# Patient Record
Sex: Male | Born: 1990 | Race: White | Hispanic: No | Marital: Single | Smoking: Never smoker
Health system: Southern US, Community
[De-identification: ages and names within clinical notes are randomized; demographics above are authoritative.]

---
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
## 2015-07-25 ENCOUNTER — Encounter (HOSPITAL_COMMUNITY): Payer: Self-pay | Admitting: *Deleted

## 2015-07-25 ENCOUNTER — Emergency Department (HOSPITAL_COMMUNITY)
Admission: EM | Admit: 2015-07-25 | Discharge: 2015-07-25 | Disposition: A | Discharge status: Home / Self Care | Payer: BLUE CROSS/BLUE SHIELD | Attending: Emergency Medicine | Admitting: Emergency Medicine

## 2015-07-25 DIAGNOSIS — J029 Acute pharyngitis, unspecified: Secondary | ICD-10-CM | POA: Diagnosis present

## 2015-07-25 DIAGNOSIS — J069 Acute upper respiratory infection, unspecified: Secondary | ICD-10-CM | POA: Diagnosis not present

## 2015-07-25 LAB — RAPID STREP SCREEN (MED CTR MEBANE ONLY): Streptococcus, Group A Screen (Direct): NEGATIVE

## 2015-07-25 NOTE — ED Notes (Signed)
Pt reports a sore throat for one week . Pt denies any fever

## 2015-07-25 NOTE — ED Notes (Signed)
Declined W/C at D/C and was escorted to lobby by RN. 

## 2015-07-25 NOTE — ED Provider Notes (Signed)
CSN: [id number]     Arrival date & time 07/25/15  1048 History  By signing my name below, I, Aaron Aguirre, attest that this documentation has been prepared under the direction and in the presence of Glean HessElizabeth Westfall, PA-C Electronically Signed: Charline BillsEssence Aguirre, ED Scribe 07/25/2015 at 11:56 AM.   Chief Complaint  Patient presents with  . Sore Throat    The history is provided by the patient. No language interpreter was used.    HPI Comments: Georga BoraZachary T Aguirre is a 25 y.o. male who presents to the Emergency Department complaining of persistent sore throat for the past week. Pt reports associated nasal congestion, postnasal drip, sinus pressure, and productive cough for the past 2 days. He denies exacerbating factors. He has tried Mucinex and a Environmental consultanteti Pot without significant relief. He denies fever, chills, chest pain, SOB, abdominal pain, nausea, vomiting. No sick contacts.   History reviewed. No pertinent past medical history. History reviewed. No pertinent past surgical history. History reviewed. No pertinent family history. Social History  Substance Use Topics  . Smoking status: Never Smoker   . Smokeless tobacco: Never Used  . Alcohol Use: No     Review of Systems  Constitutional: Negative for fever and chills.  HENT: Positive for congestion, postnasal drip, sinus pressure and sore throat.   Respiratory: Positive for cough. Negative for shortness of breath.   Cardiovascular: Negative for chest pain.  Gastrointestinal: Negative for nausea, vomiting and abdominal pain.    Allergies  Review of patient's allergies indicates no known allergies.  Home Medications   Prior to Admission medications   Not on File    BP 140/80 mmHg  Pulse 91  Temp(Src) 98 F (36.7 C) (Oral)  Resp 19  Ht 5\' 6"  (1.676 m)  Wt 240 lb (108.863 kg)  BMI 38.76 kg/m2  SpO2 97% Physical Exam  Constitutional: He is oriented to person, place, and time. He appears well-developed and well-nourished. No  distress.  HENT:  Head: Normocephalic and atraumatic.  Right Ear: External ear normal.  Left Ear: External ear normal.  Nose: Nose normal. Right sinus exhibits no maxillary sinus tenderness and no frontal sinus tenderness. Left sinus exhibits no maxillary sinus tenderness and no frontal sinus tenderness.  Mouth/Throat: Uvula is midline, oropharynx is clear and moist and mucous membranes are normal. No oropharyngeal exudate, posterior oropharyngeal edema, posterior oropharyngeal erythema or tonsillar abscesses.  Eyes: Conjunctivae, EOM and lids are normal. Pupils are equal, round, and reactive to light. Right eye exhibits no discharge. Left eye exhibits no discharge. No scleral icterus.  Neck: Normal range of motion. Neck supple.  Cardiovascular: Normal rate, regular rhythm, normal heart sounds, intact distal pulses and normal pulses.   Pulmonary/Chest: Effort normal and breath sounds normal. No respiratory distress. He has no wheezes. He has no rales.  Abdominal: Soft. Normal appearance and bowel sounds are normal. He exhibits no distension and no mass. There is no tenderness. There is no rigidity, no rebound and no guarding.  Musculoskeletal: Normal range of motion. He exhibits no edema or tenderness.  Neurological: He is alert and oriented to person, place, and time.  Skin: Skin is warm, dry and intact. No rash noted. He is not diaphoretic. No erythema. No pallor.  Psychiatric: He has a normal mood and affect. His speech is normal and behavior is normal.  Nursing note and vitals reviewed.  ED Course  Procedures (including critical care time)  DIAGNOSTIC STUDIES: Oxygen Saturation is 97% on RA, normal by my interpretation.  COORDINATION OF CARE: 11:35 AM-Discussed treatment plan which includes strep screen with pt at bedside and pt agreed to plan.   Labs Review Labs Reviewed  RAPID STREP SCREEN (NOT AT [facility])  CULTURE, GROUP A STREP   Imaging Review No results found.   I have  personally reviewed and evaluated these lab results as part of my medical decision-making.   EKG Interpretation None      MDM   Final diagnoses:  URI (upper respiratory infection)    25 year old male presents with nasal congestion, sore throat, and productive cough for the past week. States he is here because he needs a work note. Denies fever, chills, chest pain, shortness of breath, abdominal pain, nausea, vomiting.  Patient is afebrile. Vital signs stable. No nasal congestion on exam. Posterior oropharynx without significant erythema, edema, or exudate. No tonsillar abscess. Patient handling his secretions well. Heart regular rate and rhythm. Lungs clear to auscultation bilaterally. Abdomen soft, nontender, nondistended.  Rapid strep negative.  Patient is nontoxic and well-appearing, feel he is stable for discharge at this time. Symptoms likely viral. Do not feel chest x-ray is indicated, as patient denies fever, chest, pain, shortness of breath and is afebrile with stable vitals and reassuring lung exam. Advised to try warm honey, tea, throat lozenges for symptom relief. He can continue to use over-the-counter cough and cold medication. Return precautions discussed. Patient to follow up with PCP. Patient verbalizes his understanding and is in agreement with plan.  BP 140/80 mmHg  Pulse 91  Temp(Src) 98 F (36.7 C) (Oral)  Resp 19  Ht 5\' 6"  (1.676 m)  Wt 108.863 kg  BMI 38.76 kg/m2  SpO2 97%  I personally performed the services described in this documentation, which was scribed in my presence. The recorded information has been reviewed and is accurate.    Mady Gemma, PA-C 07/25/15 1411  Pricilla Loveless, MD 07/26/15 [phone]

## 2015-07-25 NOTE — ED Notes (Signed)
SEE PA Assessment 

## 2015-07-25 NOTE — Discharge Instructions (Signed)
1. Medications: usual home medications 2. Treatment: rest, drink plenty of fluids 3. Follow Up: please followup with your primary doctor for discussion of your diagnoses and further evaluation after today's visit; if you do not have a primary care doctor use the resource guide provided to find one; please return to the ER for high fever, shortness of breath, new or worsening symptoms   Upper Respiratory Infection, Adult Most upper respiratory infections (URIs) are a viral infection of the air passages leading to the lungs. A URI affects the nose, throat, and upper air passages. The most common type of URI is nasopharyngitis and is typically referred to as "the common cold." URIs run their course and usually go away on their own. Most of the time, a URI does not require medical attention, but sometimes a bacterial infection in the upper airways can follow a viral infection. This is called a secondary infection. Sinus and middle ear infections are common types of secondary upper respiratory infections. Bacterial pneumonia can also complicate a URI. A URI can worsen asthma and chronic obstructive pulmonary disease (COPD). Sometimes, these complications can require emergency medical care and may be life threatening.  CAUSES Almost all URIs are caused by viruses. A virus is a type of germ and can spread from one person to another.  RISKS FACTORS You may be at risk for a URI if:   You smoke.   You have chronic heart or lung disease.  You have a weakened defense (immune) system.   You are very young or very old.   You have nasal allergies or asthma.  You work in crowded or poorly ventilated areas.  You work in health care facilities or schools. SIGNS AND SYMPTOMS  Symptoms typically develop 2-3 days after you come in contact with a cold virus. Most viral URIs last 7-10 days. However, viral URIs from the influenza virus (flu virus) can last 14-18 days and are typically more severe. Symptoms  may include:   Runny or stuffy (congested) nose.   Sneezing.   Cough.   Sore throat.   Headache.   Fatigue.   Fever.   Loss of appetite.   Pain in your forehead, behind your eyes, and over your cheekbones (sinus pain).  Muscle aches.  DIAGNOSIS  Your health care provider may diagnose a URI by:  Physical exam.  Tests to check that your symptoms are not due to another condition such as:  Strep throat.  Sinusitis.  Pneumonia.  Asthma. TREATMENT  A URI goes away on its own with time. It cannot be cured with medicines, but medicines may be prescribed or recommended to relieve symptoms. Medicines may help:  Reduce your fever.  Reduce your cough.  Relieve nasal congestion. HOME CARE INSTRUCTIONS   Take medicines only as directed by your health care provider.   Gargle warm saltwater or take cough drops to comfort your throat as directed by your health care provider.  Use a warm mist humidifier or inhale steam from a shower to increase air moisture. This may make it easier to breathe.  Drink enough fluid to keep your urine clear or pale yellow.   Eat soups and other clear broths and maintain good nutrition.   Rest as needed.   Return to work when your temperature has returned to normal or as your health care provider advises. You may need to stay home longer to avoid infecting others. You can also use a face mask and careful hand washing to prevent spread of  the virus.  Increase the usage of your inhaler if you have asthma.   Do not use any tobacco products, including cigarettes, chewing tobacco, or electronic cigarettes. If you need help quitting, ask your health care provider. PREVENTION  The best way to protect yourself from getting a cold is to practice good hygiene.   Avoid oral or hand contact with people with cold symptoms.   Wash your hands often if contact occurs.  There is no clear evidence that vitamin C, vitamin E, echinacea, or  exercise reduces the chance of developing a cold. However, it is always recommended to get plenty of rest, exercise, and practice good nutrition.  SEEK MEDICAL CARE IF:   You are getting worse rather than better.   Your symptoms are not controlled by medicine.   You have chills.  You have worsening shortness of breath.  You have brown or red mucus.  You have yellow or brown nasal discharge.  You have pain in your face, especially when you bend forward.  You have a fever.  You have swollen neck glands.  You have pain while swallowing.  You have white areas in the back of your throat. SEEK IMMEDIATE MEDICAL CARE IF:   You have severe or persistent:  Headache.  Ear pain.  Sinus pain.  Chest pain.  You have chronic lung disease and any of the following:  Wheezing.  Prolonged cough.  Coughing up blood.  A change in your usual mucus.  You have a stiff neck.  You have changes in your:  Vision.  Hearing.  Thinking.  Mood. MAKE SURE YOU:   Understand these instructions.  Will watch your condition.  Will get help right away if you are not doing well or get worse.   This information is not intended to replace advice given to you by your health care provider. Make sure you discuss any questions you have with your health care provider.   Document Released: 01/02/2001 Document Revised: 11/23/2014 Document Reviewed: 10/14/2013 Elsevier Interactive Patient Education [employer]

## 2015-07-27 LAB — CULTURE, GROUP A STREP: Strep A Culture: NEGATIVE

## 2016-09-18 ENCOUNTER — Encounter (INDEPENDENT_AMBULATORY_CARE_PROVIDER_SITE_OTHER): Payer: Self-pay | Admitting: Ophthalmology

## 2016-10-29 ENCOUNTER — Encounter (INDEPENDENT_AMBULATORY_CARE_PROVIDER_SITE_OTHER): Payer: BLUE CROSS/BLUE SHIELD | Admitting: Ophthalmology

## 2016-10-29 DIAGNOSIS — H33301 Unspecified retinal break, right eye: Secondary | ICD-10-CM

## 2016-10-29 DIAGNOSIS — H5213 Myopia, bilateral: Secondary | ICD-10-CM

## 2016-10-29 DIAGNOSIS — H43813 Vitreous degeneration, bilateral: Secondary | ICD-10-CM | POA: Diagnosis not present

## 2016-11-13 ENCOUNTER — Ambulatory Visit (INDEPENDENT_AMBULATORY_CARE_PROVIDER_SITE_OTHER): Payer: BLUE CROSS/BLUE SHIELD | Admitting: Ophthalmology

## 2016-11-13 DIAGNOSIS — H33301 Unspecified retinal break, right eye: Secondary | ICD-10-CM

## 2017-03-15 ENCOUNTER — Ambulatory Visit (INDEPENDENT_AMBULATORY_CARE_PROVIDER_SITE_OTHER): Payer: BLUE CROSS/BLUE SHIELD | Admitting: Ophthalmology

## 2017-03-15 DIAGNOSIS — H43813 Vitreous degeneration, bilateral: Secondary | ICD-10-CM

## 2017-03-15 DIAGNOSIS — H33301 Unspecified retinal break, right eye: Secondary | ICD-10-CM

## 2021-07-21 ENCOUNTER — Emergency Department (HOSPITAL_COMMUNITY)
Admission: EM | Admit: 2021-07-21 | Discharge: 2021-07-22 | Disposition: A | Discharge status: Home / Self Care | Payer: 59 | Attending: Physician Assistant | Admitting: Physician Assistant

## 2021-07-21 ENCOUNTER — Other Ambulatory Visit: Payer: Self-pay

## 2021-07-21 DIAGNOSIS — Y92410 Unspecified street and highway as the place of occurrence of the external cause: Secondary | ICD-10-CM | POA: Diagnosis not present

## 2021-07-21 DIAGNOSIS — M79622 Pain in left upper arm: Secondary | ICD-10-CM | POA: Insufficient documentation

## 2021-07-21 DIAGNOSIS — M791 Myalgia, unspecified site: Secondary | ICD-10-CM | POA: Insufficient documentation

## 2021-07-21 DIAGNOSIS — M545 Low back pain, unspecified: Secondary | ICD-10-CM | POA: Diagnosis not present

## 2021-07-21 DIAGNOSIS — M7918 Myalgia, other site: Secondary | ICD-10-CM
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
## 2021-07-22 ENCOUNTER — Emergency Department (HOSPITAL_COMMUNITY): Payer: 59

## 2021-07-22 MED ORDER — METHOCARBAMOL 500 MG PO TABS: 500.0000 mg | ORAL_TABLET | Freq: Two times a day (BID) | ORAL | 0 refills | Status: AC

## 2021-07-22 MED ORDER — NAPROXEN 500 MG PO TABS: 500.0000 mg | ORAL_TABLET | Freq: Two times a day (BID) | ORAL | 0 refills | Status: AC
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
## 2021-07-22 NOTE — Discharge Instructions (Signed)
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
